# Patient Record
Sex: Male | Born: 1990 | Hispanic: Yes | Marital: Married | Smoking: Never smoker
Health system: Southern US, Community
[De-identification: ages and names within clinical notes are randomized; demographics above are authoritative.]

---
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
## 2020-09-22 ENCOUNTER — Emergency Department: Payer: No Typology Code available for payment source

## 2020-09-22 ENCOUNTER — Other Ambulatory Visit: Payer: Self-pay

## 2020-09-22 DIAGNOSIS — M545 Low back pain, unspecified: Secondary | ICD-10-CM | POA: Diagnosis not present

## 2020-09-22 DIAGNOSIS — R0602 Shortness of breath: Secondary | ICD-10-CM | POA: Diagnosis not present

## 2020-09-22 DIAGNOSIS — R0781 Pleurodynia: Secondary | ICD-10-CM | POA: Diagnosis not present

## 2020-09-22 DIAGNOSIS — Y92410 Unspecified street and highway as the place of occurrence of the external cause: Secondary | ICD-10-CM | POA: Insufficient documentation

## 2020-09-22 DIAGNOSIS — M79622 Pain in left upper arm: Secondary | ICD-10-CM | POA: Insufficient documentation

## 2020-09-22 DIAGNOSIS — M25512 Pain in left shoulder: Secondary | ICD-10-CM | POA: Diagnosis not present
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
## 2020-09-23 ENCOUNTER — Other Ambulatory Visit: Payer: Self-pay

## 2020-09-23 ENCOUNTER — Emergency Department: Payer: No Typology Code available for payment source

## 2020-09-23 ENCOUNTER — Emergency Department
Admission: EM | Admit: 2020-09-23 | Discharge: 2020-09-23 | Disposition: A | Discharge status: Home / Self Care | Payer: No Typology Code available for payment source | Attending: Emergency Medicine | Admitting: Emergency Medicine

## 2020-09-23 MED ORDER — IBUPROFEN 800 MG PO TABS
800.0000 mg | ORAL_TABLET | Freq: Once | ORAL | Status: AC
  Administered 2020-09-23: 800 mg via ORAL
  Filled 2020-09-23: qty 1

## 2020-09-23 MED ORDER — HYDROCODONE-ACETAMINOPHEN 5-325 MG PO TABS
1.0000 | ORAL_TABLET | Freq: Once | ORAL | Status: AC
  Administered 2020-09-23: 1 via ORAL
  Filled 2020-09-23: qty 1

## 2020-09-23 MED ORDER — METHOCARBAMOL 500 MG PO TABS: 500.0000 mg | ORAL_TABLET | Freq: Three times a day (TID) | ORAL | 0 refills | Status: AC | PRN

## 2020-09-23 MED ORDER — IBUPROFEN 800 MG PO TABS: 800.0000 mg | ORAL_TABLET | Freq: Three times a day (TID) | ORAL | 0 refills | Status: AC | PRN

## 2020-09-23 NOTE — Discharge Instructions (Addendum)
You may alternate Tylenol 1000 mg every 6 hours as needed for pain, fever and Ibuprofen 800 mg every 8 hours as needed for pain, fever.  Please take Ibuprofen with food.  Do not take more than 4000 mg of Tylenol (acetaminophen) in a 24 hour period.  
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
## 2022-01-22 ENCOUNTER — Other Ambulatory Visit: Payer: Self-pay
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
